# Patient Record
Sex: Male | Born: 1965 | Race: White | Hispanic: No | Marital: Single
Health system: Southern US, Community
[De-identification: ages and names within clinical notes are randomized; demographics above are authoritative.]

---

## 2007-04-22 ENCOUNTER — Ambulatory Visit: Payer: Self-pay | Admitting: Gastroenterology

## 2007-04-22 LAB — CONVERTED CEMR LAB
A-1 Antitrypsin, Ser: 103 mg/dL (ref 83–200)
ALT: 70 units/L — ABNORMAL HIGH (ref 0–53)
AST: 37 units/L (ref 0–37)
Albumin: 4.4 g/dL (ref 3.5–5.2)
Alkaline Phosphatase: 58 units/L (ref 39–117)
BUN: 19 mg/dL (ref 6–23)
Basophils Absolute: 0 10*3/uL (ref 0.0–0.1)
Basophils Relative: 0.3 % (ref 0.0–1.0)
CO2: 29 meq/L (ref 19–32)
Calcium: 10 mg/dL (ref 8.4–10.5)
Ceruloplasmin: 26 mg/dL (ref 21–63)
Chloride: 102 meq/L (ref 96–112)
Creatinine, Ser: 1.1 mg/dL (ref 0.4–1.5)
Hemoglobin: 16.5 g/dL (ref 13.0–17.0)
Hep B S Ab: NEGATIVE
Hepatitis B Surface Ag: NEGATIVE
INR: 0.9 (ref 0.8–1.0)
Monocytes Absolute: 0.6 10*3/uL (ref 0.2–0.7)
Monocytes Relative: 9.2 % (ref 3.0–11.0)
Potassium: 4.5 meq/L (ref 3.5–5.1)
Prothrombin Time: 11.7 s (ref 10.9–13.3)
RBC: 4.99 M/uL (ref 4.22–5.81)
RDW: 12.1 % (ref 11.5–14.6)
TSH: 3.07 microintl units/mL (ref 0.35–5.50)
Total Bilirubin: 1.4 mg/dL — ABNORMAL HIGH (ref 0.3–1.2)
Total Protein: 7 g/dL (ref 6.0–8.3)

## 2007-08-24 DIAGNOSIS — B159 Hepatitis A without hepatic coma: Secondary | ICD-10-CM | POA: Insufficient documentation

## 2007-08-24 DIAGNOSIS — R945 Abnormal results of liver function studies: Secondary | ICD-10-CM | POA: Insufficient documentation

## 2007-08-24 DIAGNOSIS — J309 Allergic rhinitis, unspecified: Secondary | ICD-10-CM | POA: Insufficient documentation

## 2007-10-28 ENCOUNTER — Encounter (INDEPENDENT_AMBULATORY_CARE_PROVIDER_SITE_OTHER): Payer: Self-pay | Admitting: *Deleted

## 2008-01-26 ENCOUNTER — Ambulatory Visit: Payer: Self-pay | Admitting: Gastroenterology

## 2008-02-01 ENCOUNTER — Telehealth (INDEPENDENT_AMBULATORY_CARE_PROVIDER_SITE_OTHER): Payer: Self-pay | Admitting: *Deleted

## 2008-02-03 ENCOUNTER — Encounter (INDEPENDENT_AMBULATORY_CARE_PROVIDER_SITE_OTHER): Payer: Self-pay | Admitting: *Deleted

## 2008-02-03 LAB — CONVERTED CEMR LAB
ALT: 100 units/L — ABNORMAL HIGH (ref 0–53)
AST: 44 units/L — ABNORMAL HIGH (ref 0–37)
Total Protein: 6.9 g/dL (ref 6.0–8.3)

## 2008-02-10 ENCOUNTER — Ambulatory Visit: Payer: Self-pay | Admitting: Gastroenterology

## 2008-02-10 DIAGNOSIS — R740 Nonspecific elevation of levels of transaminase and lactic acid dehydrogenase [LDH]: Secondary | ICD-10-CM

## 2008-02-10 DIAGNOSIS — E669 Obesity, unspecified: Secondary | ICD-10-CM

## 2008-05-14 ENCOUNTER — Ambulatory Visit: Payer: Self-pay | Admitting: Gastroenterology

## 2008-05-14 ENCOUNTER — Telehealth (INDEPENDENT_AMBULATORY_CARE_PROVIDER_SITE_OTHER): Payer: Self-pay | Admitting: *Deleted

## 2008-05-16 LAB — CONVERTED CEMR LAB
ALT: 50 units/L (ref 0–53)
Bilirubin, Direct: 0.1 mg/dL (ref 0.0–0.3)
Total Bilirubin: 1 mg/dL (ref 0.3–1.2)
Total Protein: 6.9 g/dL (ref 6.0–8.3)

## 2008-06-06 ENCOUNTER — Encounter (HOSPITAL_COMMUNITY): Admission: RE | Admit: 2008-06-06 | Discharge: 2008-06-28 | Payer: Self-pay | Admitting: Orthopedic Surgery

## 2008-07-16 ENCOUNTER — Encounter: Payer: Self-pay | Admitting: Gastroenterology

## 2010-01-08 ENCOUNTER — Telehealth: Payer: Self-pay | Admitting: Gastroenterology

## 2010-07-29 NOTE — Progress Notes (Signed)
Summary: Schedule Office Visit  Phone Note Outgoing Call Call back at [facility] Phone [phone]   Call placed by: Harlow Mares CMA Duncan Dull),  January 08, 2010 4:27 PM Call placed to: Patient Summary of Call: Left message on patients machine to call back. patient is due for an office visit  Initial call taken by: Harlow Mares CMA Duncan Dull),  January 08, 2010 4:27 PM  Follow-up for Phone Call        Left message on patients machine to call back. patient is due for a recall office visit. we will mail him a letter to remind him that he is due for an office visit. Follow-up by: Harlow Mares CMA Duncan Dull),  January 15, 2010 4:44 PM

## 2010-11-11 NOTE — Assessment & Plan Note (Signed)
HEALTHCARE                         GASTROENTEROLOGY OFFICE NOTE   NAME:ELSEYNaeem, Quillin                         MRN:          [id number]  DATE:04/22/2007                            DOB:          14-Mar-1966    REASON FOR REFERRAL:  Dr. Collins Scotland asked me to evaluate Mr. Alexopoulos in  consultation regarding abnormal liver tests.   HISTORY OF PRESENT ILLNESS:  Mr. Sigl is a very pleasant 45 year old  man who originally was told that he had abnormal liver tests  approximately two years ago when he lived in [place], [place]. He was  taking a lot of Tylenol PM and they thought maybe it was from that. He  had an ultrasound at the time, which he tells me did not show any  abnormalities in his liver or his gallbladder. Repeat liver tests done  shortly thereafter were normal. He recently moved to the area here and  had a repeat set of basic lab testing. This shows two abnormalities in  his liver test. Specifically, in that his ALT was 78, the upper limit of  normal which is 45. His GGT was slightly elevated at 110. His alkaline  phosphatase was normal and his total bilirubin was normal. He has had no  specific right upper quadrant discomforts. He did have hepatitis A when  he was 45 years old and was hospitalized for 4 to 6 weeks at that point,  but he never had any jaundice since then.   REVIEW OF SYSTEMS:  Notable for a stable weight, otherwise essentially  normal as available on his nursing intake sheet.   PAST MEDICAL HISTORY:  Hepatitis A when he was young, seasonal  allergies.   CURRENT MEDICATIONS:  1. Allegra.  2. Flonase.  3. Vitamin C.  4. Magnesium.  5. Zinc.   No other over-the-counter medicines. No herbs or vitamins.   ALLERGIES:  CODEINE causes itching.   SOCIAL HISTORY:  Single. He lives with his 71 year old son. He recently  moved to the area 7 months ago, works as an Web designer. Non-smoker, non-  drinker.   FAMILY HISTORY:  No liver disease  in family.   PHYSICAL EXAMINATION:  VITAL SIGNS:  Height 6 foot 0 inches, weight 193  pounds, blood pressure 102/60, pulse 68.  CONSTITUTIONAL:  Generally well appearing, anicteric sclerae, awake,  alert, and oriented x3.  EYES:  Extraocular movements intact.  MOUTH:  Oropharynx moist. No lesions.  NECK:  Supple. No lymphadenopathy.  CARDIOVASCULAR:  Heart is regular rate and rhythm.  LUNGS:  Clear to auscultation bilaterally.  ABDOMEN:  Soft, nontender, and nondistended. Normal bowel sounds.  EXTREMITIES:  No lower extremity edema.  SKIN:  No rash or lesions on visible extremities.   ASSESSMENT AND PLAN:  This is a 45 year old man with abnormal liver  tests.   His ALT is slightly elevated at 78. His GGT is elevated at 110. The  remaining liver tests were all normal. He will fax a report from his  2006 ultrasound over for review here. He will also get a basic set of  labs including repeat hepatic function  panel, screen for hepatitis B, C,  ANA, AMA, antismooth muscle antibodies, ceruloplasmin, alpha-1  antitrypsin, TTG, thyroid testing, and iron studies. I will communicate  the results of that testing with him.     Rachael Fee, MD  Electronically Signed    DPJ/MedQ  DD: 04/22/2007  DT: 04/23/2007  Job #: [id number]   cc:   Tammy R. Collins Scotland, M.D.

## 2011-12-01 ENCOUNTER — Encounter: Payer: Self-pay | Admitting: Gastroenterology

## 2020-10-01 ENCOUNTER — Emergency Department (HOSPITAL_BASED_OUTPATIENT_CLINIC_OR_DEPARTMENT_OTHER): Payer: BC Managed Care – PPO

## 2020-10-01 ENCOUNTER — Ambulatory Visit (HOSPITAL_COMMUNITY)
Admission: EM | Admit: 2020-10-01 | Discharge: 2020-10-01 | Disposition: A | Discharge status: Home / Self Care | Payer: BC Managed Care – PPO

## 2020-10-01 ENCOUNTER — Encounter (HOSPITAL_BASED_OUTPATIENT_CLINIC_OR_DEPARTMENT_OTHER): Payer: Self-pay | Admitting: *Deleted

## 2020-10-01 ENCOUNTER — Other Ambulatory Visit: Payer: Self-pay

## 2020-10-01 ENCOUNTER — Emergency Department (HOSPITAL_BASED_OUTPATIENT_CLINIC_OR_DEPARTMENT_OTHER)
Admission: EM | Admit: 2020-10-01 | Discharge: 2020-10-01 | Disposition: A | Discharge status: Home / Self Care | Payer: BC Managed Care – PPO | Attending: Emergency Medicine | Admitting: Emergency Medicine

## 2020-10-01 DIAGNOSIS — K5792 Diverticulitis of intestine, part unspecified, without perforation or abscess without bleeding: Secondary | ICD-10-CM

## 2020-10-01 DIAGNOSIS — K5732 Diverticulitis of large intestine without perforation or abscess without bleeding: Secondary | ICD-10-CM | POA: Diagnosis not present

## 2020-10-01 DIAGNOSIS — R1031 Right lower quadrant pain: Secondary | ICD-10-CM | POA: Diagnosis present

## 2020-10-01 LAB — CBC WITH DIFFERENTIAL/PLATELET
Abs Immature Granulocytes: 0.03 10*3/uL (ref 0.00–0.07)
Basophils Absolute: 0.1 10*3/uL (ref 0.0–0.1)
Basophils Relative: 1 %
Eosinophils Absolute: 0.2 10*3/uL (ref 0.0–0.5)
Eosinophils Relative: 2 %
HCT: 49 % (ref 39.0–52.0)
Hemoglobin: 17.1 g/dL — ABNORMAL HIGH (ref 13.0–17.0)
Immature Granulocytes: 0 %
Lymphocytes Relative: 15 %
Lymphs Abs: 1.4 10*3/uL (ref 0.7–4.0)
MCH: 33.5 pg (ref 26.0–34.0)
MCHC: 34.9 g/dL (ref 30.0–36.0)
MCV: 95.9 fL (ref 80.0–100.0)
Monocytes Absolute: 1 10*3/uL (ref 0.1–1.0)
Monocytes Relative: 10 %
Neutro Abs: 7.1 10*3/uL (ref 1.7–7.7)
Neutrophils Relative %: 72 %
Platelets: 178 10*3/uL (ref 150–400)
RBC: 5.11 MIL/uL (ref 4.22–5.81)
RDW: 13 % (ref 11.5–15.5)
WBC: 9.9 10*3/uL (ref 4.0–10.5)
nRBC: 0 % (ref 0.0–0.2)

## 2020-10-01 LAB — LIPASE, BLOOD: Lipase: 25 U/L (ref 11–51)

## 2020-10-01 LAB — COMPREHENSIVE METABOLIC PANEL
ALT: 53 U/L — ABNORMAL HIGH (ref 0–44)
AST: 25 U/L (ref 15–41)
Albumin: 4.5 g/dL (ref 3.5–5.0)
Alkaline Phosphatase: 50 U/L (ref 38–126)
Anion gap: 6 (ref 5–15)
BUN: 18 mg/dL (ref 6–20)
CO2: 32 mmol/L (ref 22–32)
Calcium: 9.5 mg/dL (ref 8.9–10.3)
Chloride: 102 mmol/L (ref 98–111)
Creatinine, Ser: 1.27 mg/dL — ABNORMAL HIGH (ref 0.61–1.24)
GFR, Estimated: >60 mL/min (ref >60)
Glucose, Bld: 101 mg/dL — ABNORMAL HIGH (ref 70–99)
Potassium: 4.1 mmol/L (ref 3.5–5.1)
Sodium: 140 mmol/L (ref 135–145)
Total Bilirubin: 1.3 mg/dL — ABNORMAL HIGH (ref 0.3–1.2)
Total Protein: 7.2 g/dL (ref 6.5–8.1)

## 2020-10-01 LAB — URINALYSIS, ROUTINE W REFLEX MICROSCOPIC
Bilirubin Urine: NEGATIVE
Glucose, UA: NEGATIVE mg/dL
Hgb urine dipstick: NEGATIVE
Ketones, ur: NEGATIVE mg/dL
Leukocytes,Ua: NEGATIVE
Nitrite: NEGATIVE
Protein, ur: NEGATIVE mg/dL
Specific Gravity, Urine: 1.024 (ref 1.005–1.030)
pH: 6 (ref 5.0–8.0)

## 2020-10-01 MED ORDER — ONDANSETRON 4 MG PO TBDP: 4.0000 mg | ORAL_TABLET | Freq: Three times a day (TID) | ORAL | 0 refills | Status: AC | PRN

## 2020-10-01 MED ORDER — IOHEXOL 300 MG/ML  SOLN
100.0000 mL | Freq: Once | INTRAMUSCULAR | Status: AC | PRN
  Administered 2020-10-01: 100 mL via INTRAVENOUS

## 2020-10-01 MED ORDER — AMOXICILLIN-POT CLAVULANATE 875-125 MG PO TABS
1.0000 | ORAL_TABLET | Freq: Once | ORAL | Status: AC
  Administered 2020-10-01: 1 via ORAL
  Filled 2020-10-01: qty 1

## 2020-10-01 MED ORDER — AMOXICILLIN-POT CLAVULANATE 875-125 MG PO TABS: 1.0000 | ORAL_TABLET | Freq: Two times a day (BID) | ORAL | 0 refills | Status: AC

## 2020-10-01 NOTE — ED Notes (Signed)
NPO since 0930hrs Pt informed urine spec is requested

## 2020-10-01 NOTE — ED Notes (Signed)
Pt discharged home after verbalizing understanding of discharge instructions; nad noted. 

## 2020-10-01 NOTE — ED Notes (Signed)
Patient transported to CT 

## 2020-10-01 NOTE — ED Provider Notes (Signed)
[facility] EMERGENCY DEPT Provider Note   CSN: [id number] Arrival date & time: 10/01/20  1015     History CC:  Abdominal pain  Blake Garza is a 55 y.o. male reports no significant past medical history present emergency department with abdominal pain.  He reports symptoms ongoing for about 5 days.  Pain currently is mild to moderate.  He has had persistent right lower quadrant abdominal pain, which is constant and dull.  Does not radiate.  The past 24 hours he reports possibly low-grade fever and some nausea.  He denies vomiting or diarrhea, but does report some loose bowel movement.  He states in the past he has had episodes of nausea and vague abdominal pain that have been treated empirically with Augmentin, generally gets better.  He has never had abdominal surgery.  He is unaware of any history of diverticulitis or colitis.  Is never had a colonoscopy.  He does appear a little bit of dysuria today.    He denies any other medical problems.  He reports no known drug allergies.  He otherwise feels well.  HPI     History reviewed. No pertinent past medical history.  Patient Active Problem List   Diagnosis Date Noted  . OBESITY 02/10/2008  . ABNORMAL TRANSAMINASE-LFT'S 02/10/2008  . HEPATITIS A 08/24/2007  . ALLERGIC RHINITIS 08/24/2007  . LIVER FUNCTION TESTS, ABNORMAL 08/24/2007    History reviewed. No pertinent surgical history.     History reviewed. No pertinent family history.     Home Medications Prior to Admission medications   Medication Sig Start Date End Date Taking? Authorizing Provider  amoxicillin-clavulanate (AUGMENTIN) 875-125 MG tablet Take 1 tablet by mouth 2 (two) times daily for 7 days. 10/01/20 10/08/20 Yes Jetta Murray, Kermit Balo, MD  ondansetron (ZOFRAN ODT) 4 MG disintegrating tablet Take 1 tablet (4 mg total) by mouth every 8 (eight) hours as needed for up to 15 doses for nausea or vomiting. 10/01/20  Yes Tationa Stech, Kermit Balo, MD    Allergies     Codeine  Review of Systems   Review of Systems  Constitutional: Positive for fever. Negative for chills.  Respiratory: Negative for cough and shortness of breath.   Cardiovascular: Negative for chest pain and palpitations.  Gastrointestinal: Positive for abdominal pain and nausea. Negative for blood in stool and vomiting.  Genitourinary: Positive for dysuria. Negative for hematuria.  Skin: Negative for color change and rash.  Neurological: Negative for weakness and numbness.  Psychiatric/Behavioral: Negative for agitation and confusion.  All other systems reviewed and are negative.   Physical Exam Updated Vital Signs BP 130/86 (BP Location: Right Arm)   Pulse 68   Temp 98.2 F (36.8 C) (Oral)   Resp 18   Ht 6' (1.829 m)   Wt 93 kg   SpO2 100%   BMI 27.80 kg/m   Physical Exam Constitutional:      General: He is not in acute distress. HENT:     Head: Normocephalic and atraumatic.  Eyes:     Conjunctiva/sclera: Conjunctivae normal.     Pupils: Pupils are equal, round, and reactive to light.  Cardiovascular:     Rate and Rhythm: Normal rate and regular rhythm.  Pulmonary:     Effort: Pulmonary effort is normal. No respiratory distress.  Abdominal:     General: There is no distension.     Tenderness: There is abdominal tenderness in the right lower quadrant, suprapubic area and left lower quadrant. There is no guarding or rebound. Negative  signs include Murphy's sign and Rovsing's sign.  Skin:    General: Skin is warm and dry.  Neurological:     General: No focal deficit present.     Mental Status: He is alert. Mental status is at baseline.  Psychiatric:        Mood and Affect: Mood normal.        Behavior: Behavior normal.     ED Results / Procedures / Treatments   Labs (all labs ordered are listed, but only abnormal results are displayed) Labs Reviewed  COMPREHENSIVE METABOLIC PANEL - Abnormal; Notable for the following components:      Result Value    Glucose, Bld 101 (*)    Creatinine, Ser 1.27 (*)    ALT 53 (*)    Total Bilirubin 1.3 (*)    All other components within normal limits  CBC WITH DIFFERENTIAL/PLATELET - Abnormal; Notable for the following components:   Hemoglobin 17.1 (*)    All other components within normal limits  URINALYSIS, ROUTINE W REFLEX MICROSCOPIC  LIPASE, BLOOD    EKG None  Radiology CT ABDOMEN PELVIS W CONTRAST  Result Date: 10/01/2020 CLINICAL DATA:  Abdominal pain, fever and nausea. EXAM: CT ABDOMEN AND PELVIS WITH CONTRAST TECHNIQUE: Multidetector CT imaging of the abdomen and pelvis was performed using the standard protocol following bolus administration of intravenous contrast. CONTRAST:  OMNIPAQUE IOHEXOL 300 MG/ML  SOLN COMPARISON:  None. FINDINGS: Lower chest: No acute abnormality. Hepatobiliary: No focal liver abnormality is seen. No gallstones, gallbladder wall thickening, or biliary dilatation. Pancreas: Unremarkable. No pancreatic ductal dilatation or surrounding inflammatory changes. Spleen: Normal in size without focal abnormality. Adrenals/Urinary Tract: Adrenal glands are unremarkable. Kidneys are normal, without renal calculi, focal lesion, or hydronephrosis. Bladder is unremarkable. Stomach/Bowel: Normal appendix. Scattered colonic diverticula. Inflammatory stranding present surrounding an enlarged and inflamed diverticulum in the sigmoid colon. There is a small amount of associated submucosal edema. Findings are consistent with acute diverticulitis. No evidence of perforation or abscess. Vascular/Lymphatic: No significant vascular findings are present. No enlarged abdominal or pelvic lymph nodes. Reproductive: Prostate is unremarkable. Other: No abdominal wall hernia or abnormality. No abdominopelvic ascites. Musculoskeletal: No acute fracture or aggressive appearing lytic or blastic osseous lesion. IMPRESSION: 1. Acute uncomplicated sigmoid diverticulitis. 2. No additional abnormality  identified. Electronically Signed   By: Malachy Moan M.D.   On: 10/01/2020 11:52    Procedures Procedures   Medications Ordered in ED Medications  amoxicillin-clavulanate (AUGMENTIN) 875-125 MG per tablet 1 tablet (has no administration in time range)  iohexol (OMNIPAQUE) 300 MG/ML solution 100 mL (100 mLs Intravenous Contrast Given 10/01/20 1119)    ED Course  I have reviewed the triage vital signs and the nursing notes.  Pertinent labs & imaging results that were available during my care of the patient were reviewed by me and considered in my medical decision making (see chart for details).  This patient presents to the Emergency Department with complaint of abdominal pain. This involves an extensive number of treatment options, and is a complaint that carries with it a high risk of complications and morbidity.  The differential diagnosis includes, but is not limited to, UTI vs colitis vs ileitis vs appendicitis vs other  Overall reassuring exam, he is quite well appearing Lower suspicion for pyelonephritis, sepsis.  I ordered, reviewed, and interpreted labs, including CMP and CBC.   Likely some mild hemoconcentration here.  CR 1.27.  There were no immediate, life-threatening emergencies found in this labwork.  Patient's  UA showed no signs of infection I ordered imaging studies which included CT abdomen/pelvis with IV contrast I independently visualized and interpreted imaging which showed acute sigmoid diverticulitis without evidence of perforation or appendicitis.  After the interventions stated above, I reevaluated the patient and found that they remained clinically stable with minimal discomfort.  Based on the patient's clinical exam, vital signs, risk factors, and ED testing, I felt that the patient's overall risk of life-threatening emergency such as bowel perforation, surgical emergency, or sepsis was quite low.  I suspect this clinical presentation is most consistent with  diverticulitis.  I discussed outpatient follow up with primary care provider, and provided specialist office number on the patient's discharge paper if a referral was deemed necessary.  I discussed return precautions with the patient. I felt the patient was clinically stable for discharge.  Clinical Course as of 10/01/20 1207  Tue Oct 01, 2020  1158 IMPRESSION: 1. Acute uncomplicated sigmoid diverticulitis. 2. No additional abnormality identified. [MT]  1205 Reviewed with the patient these findings.  Appears to have some diverticulitis.  Will prescribe him Augmentin 7 days.  Will give GI follow up for recurring episodes diverticulitis; he could benefit from screening colonoscopy at his age too [MT]    Clinical Course User Index [MT] Deborra Phegley, Kermit Balo, MD    Final Clinical Impression(s) / ED Diagnoses Final diagnoses:  Diverticulitis    Rx / DC Orders ED Discharge Orders         Ordered    ondansetron (ZOFRAN ODT) 4 MG disintegrating tablet  Every 8 hours PRN        10/01/20 1143    amoxicillin-clavulanate (AUGMENTIN) 875-125 MG tablet  2 times daily        10/01/20 1205           Terald Sleeper, MD 10/01/20 1207

## 2020-10-01 NOTE — ED Triage Notes (Signed)
abd pain, low grade fever in the past 24 hours, having a lot of nausea, denies vomiting, diarrhea or constipation

## 2021-02-18 ENCOUNTER — Other Ambulatory Visit (HOSPITAL_BASED_OUTPATIENT_CLINIC_OR_DEPARTMENT_OTHER): Payer: Self-pay | Admitting: Gastroenterology

## 2021-02-18 DIAGNOSIS — R1032 Left lower quadrant pain: Secondary | ICD-10-CM

## 2021-02-19 ENCOUNTER — Other Ambulatory Visit: Payer: Self-pay

## 2021-02-19 ENCOUNTER — Encounter (HOSPITAL_BASED_OUTPATIENT_CLINIC_OR_DEPARTMENT_OTHER): Payer: Self-pay

## 2021-02-19 ENCOUNTER — Ambulatory Visit (HOSPITAL_BASED_OUTPATIENT_CLINIC_OR_DEPARTMENT_OTHER)
Admission: RE | Admit: 2021-02-19 | Discharge: 2021-02-19 | Disposition: A | Discharge status: Home / Self Care | Payer: BC Managed Care – PPO | Source: Ambulatory Visit | Attending: Gastroenterology | Admitting: Gastroenterology

## 2021-02-19 DIAGNOSIS — R1032 Left lower quadrant pain: Secondary | ICD-10-CM | POA: Diagnosis not present

## 2021-02-19 MED ORDER — IOHEXOL 350 MG/ML SOLN
75.0000 mL | Freq: Once | INTRAVENOUS | Status: AC | PRN
  Administered 2021-02-19: 75 mL via INTRAVENOUS

## 2022-02-20 IMAGING — CT CT ABD-PELV W/ CM
1 of 3 series · 14 of 32 positions shown, 19 images · IV contrast (APPLIED)
Comparison: None.

CLINICAL DATA: Abdominal pain, fever and nausea.

EXAM:
CT ABDOMEN AND PELVIS WITH CONTRAST
TECHNIQUE: Multidetector CT imaging of the abdomen and pelvis was performed
using the standard protocol following bolus administration of
intravenous contrast.
CONTRAST:  100mL OMNIPAQUE IOHEXOL 300 MG/ML  SOLN

[Series 2: abd pel w · axial · 0.78mm/px · z∈[+875,+1285]mm · 14 of 94 slices shown, 19 images]
[im 6/94  soft-tissue]
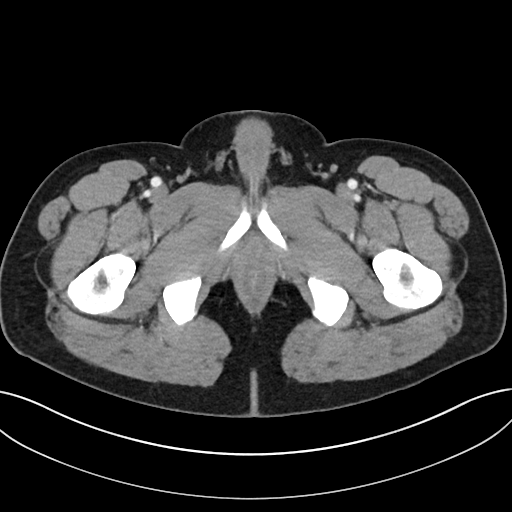
[im 6/94  bone]
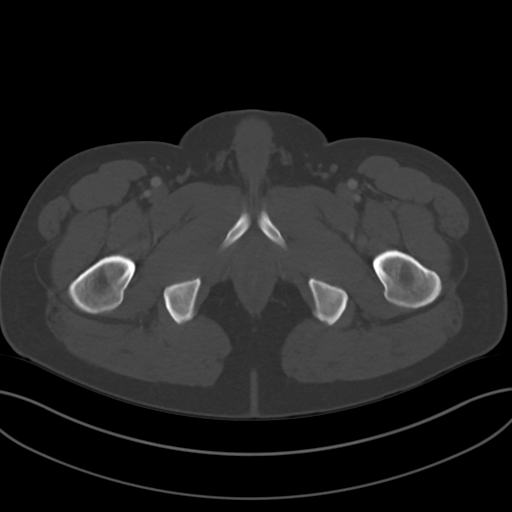
[im 11/94  soft-tissue]
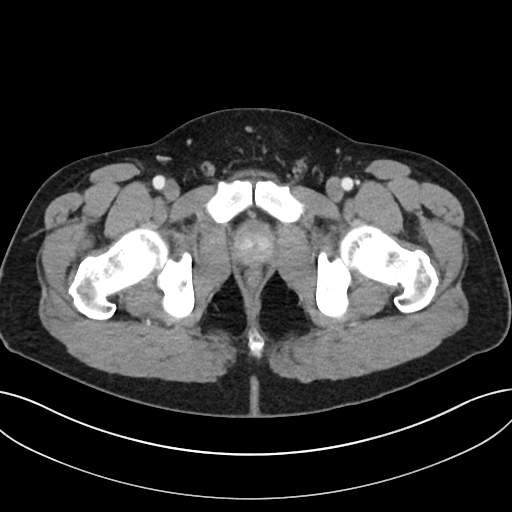
[im 21/94  soft-tissue]
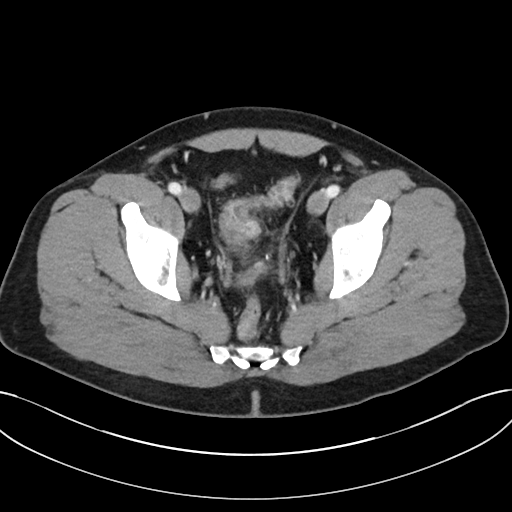
[im 26/94  soft-tissue]
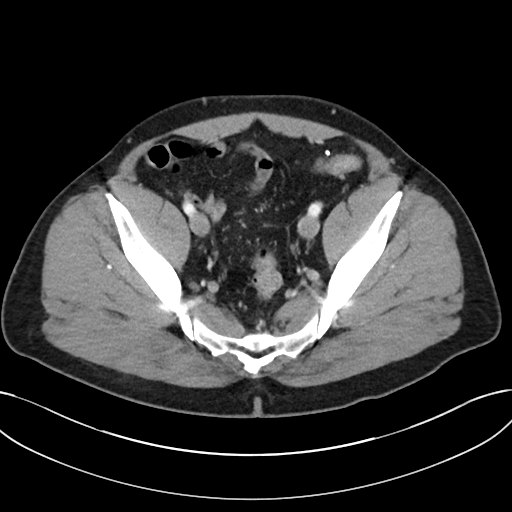
[im 32/94  soft-tissue]
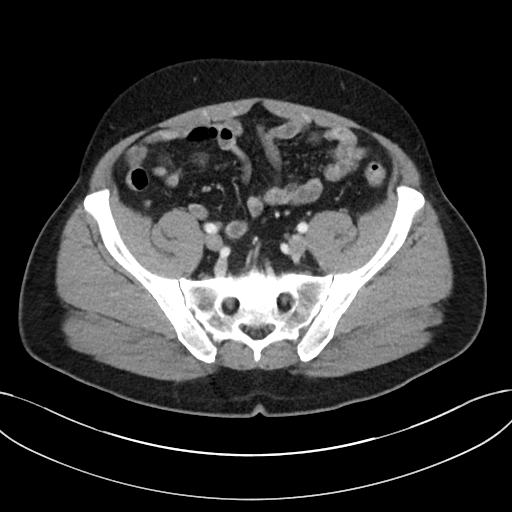
[im 42/94  soft-tissue]
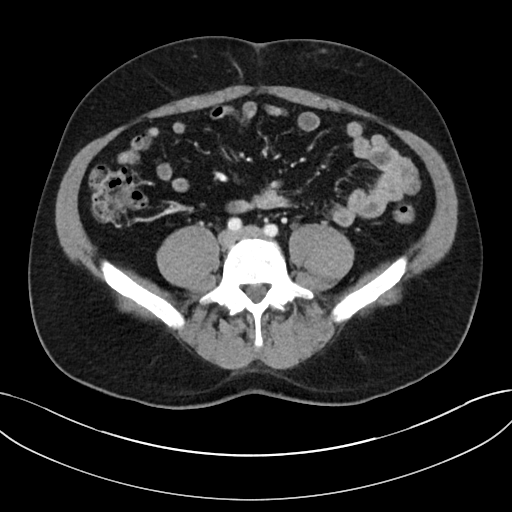
[im 47/94  soft-tissue]
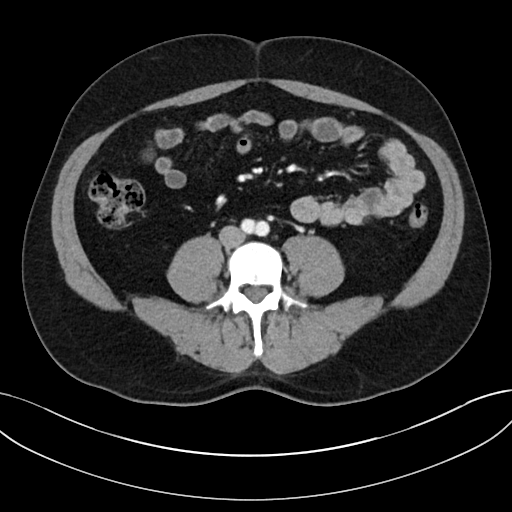
[im 52/94  soft-tissue]
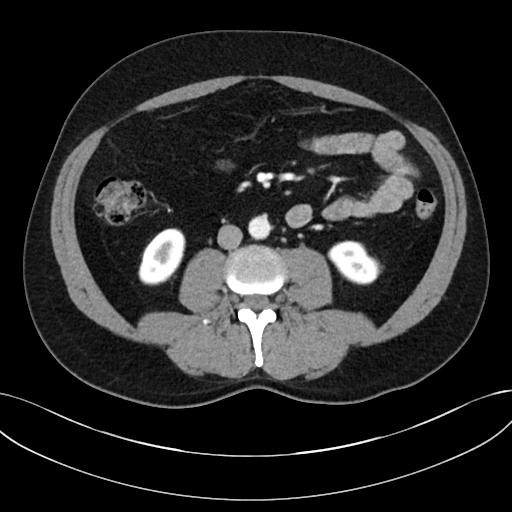
[im 63/94  soft-tissue]
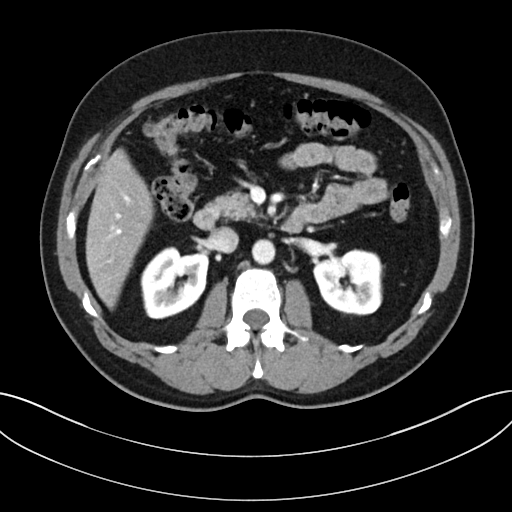
[im 63/94  bone]
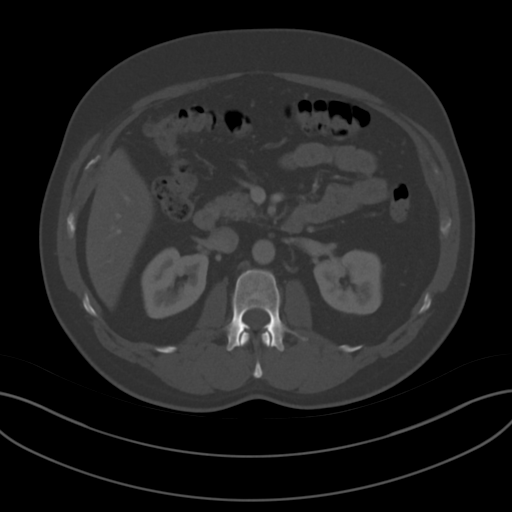
[im 68/94  soft-tissue]
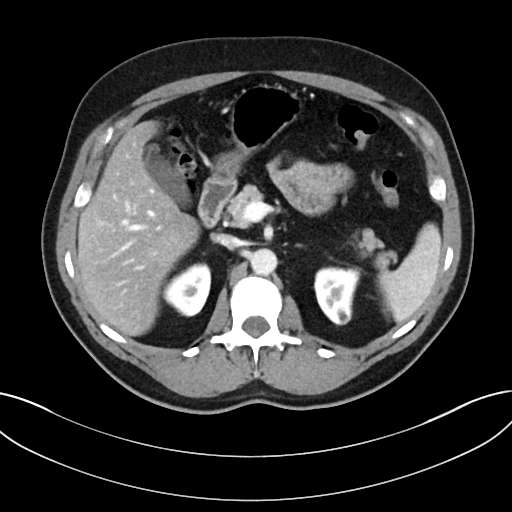
[im 73/94  soft-tissue]
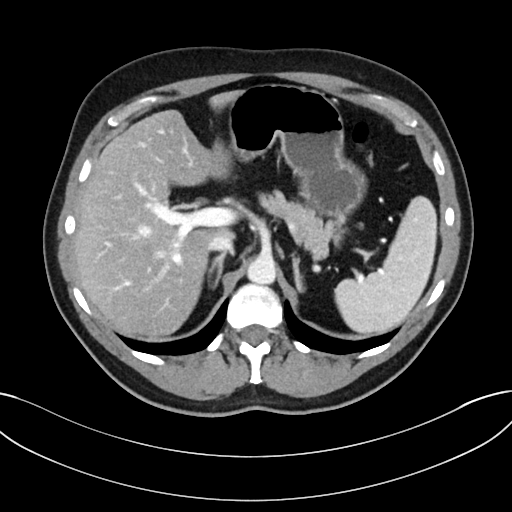
[im 73/94  lung]
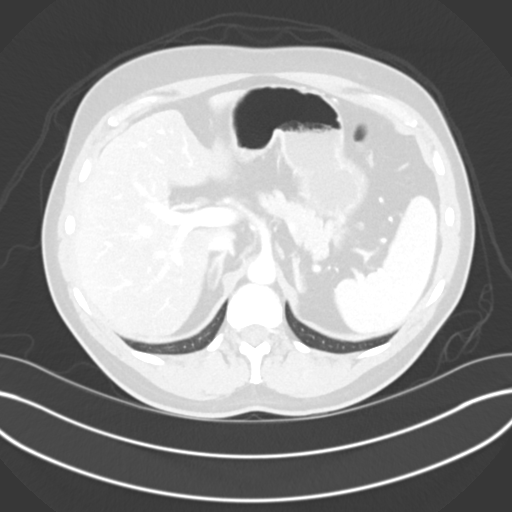
[im 78/94  lung]
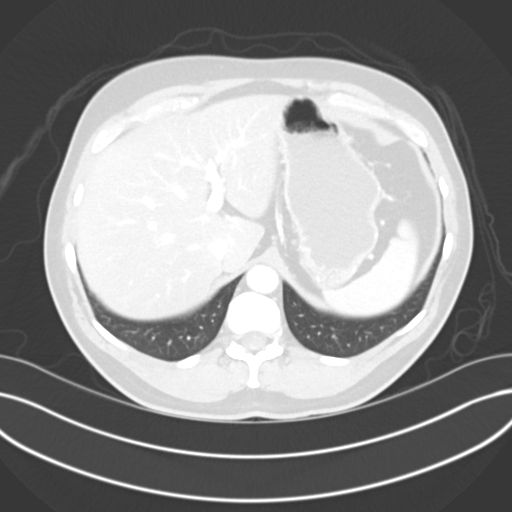
[im 83/94  soft-tissue]
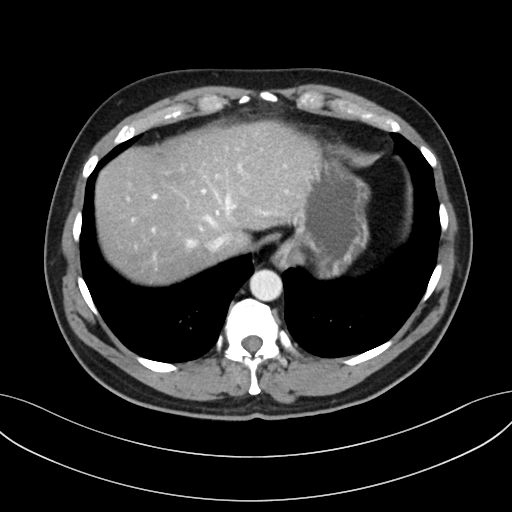
[im 83/94  lung]
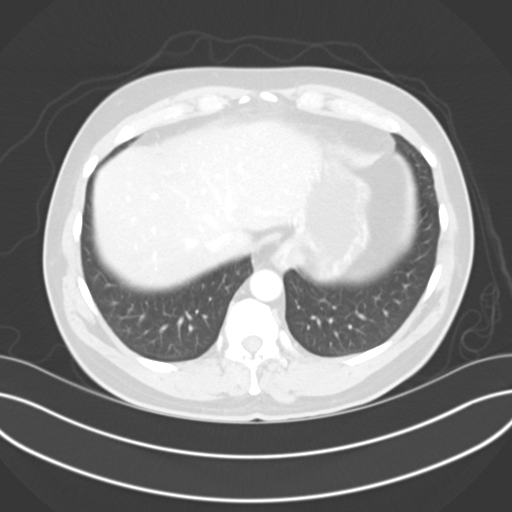
[im 88/94  soft-tissue]
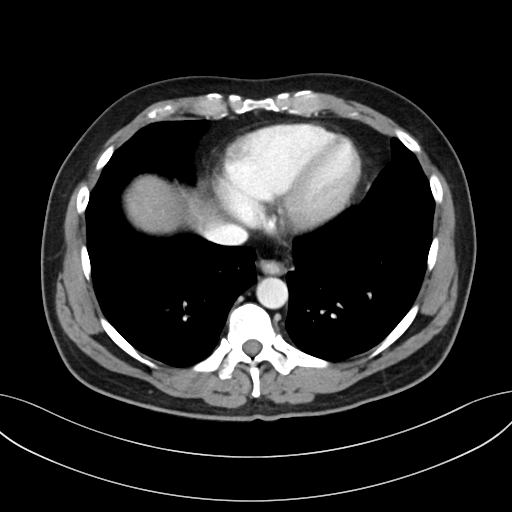
[im 88/94  lung]
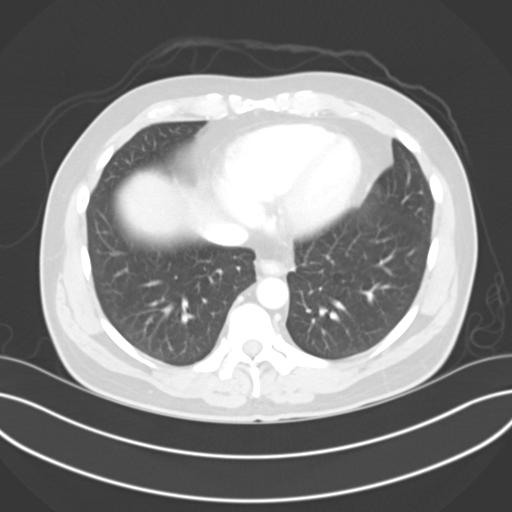

[14 of 32 positions shown; findings below may reference images not displayed]

FINDINGS: Lower chest: No acute abnormality.

Hepatobiliary: No focal liver abnormality is seen. No gallstones,
gallbladder wall thickening, or biliary dilatation.

Pancreas: Unremarkable. No pancreatic ductal dilatation or
surrounding inflammatory changes.

Spleen: Normal in size without focal abnormality.

Adrenals/Urinary Tract: Adrenal glands are unremarkable. Kidneys are
normal, without renal calculi, focal lesion, or hydronephrosis.
Bladder is unremarkable.

Stomach/Bowel: Normal appendix. Scattered colonic diverticula.
Inflammatory stranding present surrounding an enlarged and inflamed
diverticulum in the sigmoid colon. There is a small amount of
associated submucosal edema. Findings are consistent with acute
diverticulitis. No evidence of perforation or abscess.

Vascular/Lymphatic: No significant vascular findings are present. No
enlarged abdominal or pelvic lymph nodes.

Reproductive: Prostate is unremarkable.

Other: No abdominal wall hernia or abnormality. No abdominopelvic
ascites.

Musculoskeletal: No acute fracture or aggressive appearing lytic or
blastic osseous lesion.
IMPRESSION: 1. Acute uncomplicated sigmoid diverticulitis.
2. No additional abnormality identified.
